# Patient Record
Sex: Female | Born: 1945 | Race: White | Hispanic: No | Marital: Married | Smoking: Never smoker
Health system: Southern US, Community
[De-identification: ages and names within clinical notes are randomized; demographics above are authoritative.]

## PROBLEM LIST (undated history)

## (undated) DIAGNOSIS — I10 Essential (primary) hypertension: Secondary | ICD-10-CM

## (undated) DIAGNOSIS — I639 Cerebral infarction, unspecified: Secondary | ICD-10-CM

## (undated) DIAGNOSIS — E7800 Pure hypercholesterolemia, unspecified: Secondary | ICD-10-CM

## (undated) HISTORY — DX: Cerebral infarction, unspecified: I63.9

## (undated) HISTORY — DX: Pure hypercholesterolemia, unspecified: E78.00

## (undated) HISTORY — PX: BREAST BIOPSY: SHX20

## (undated) HISTORY — PX: CHOLECYSTECTOMY: SHX55

## (undated) HISTORY — DX: Essential (primary) hypertension: I10

## (undated) HISTORY — PX: BREAST EXCISIONAL BIOPSY: SUR124

---

## 2015-03-24 ENCOUNTER — Other Ambulatory Visit: Payer: Self-pay | Admitting: Family Medicine

## 2015-03-24 DIAGNOSIS — N6459 Other signs and symptoms in breast: Secondary | ICD-10-CM

## 2015-04-02 ENCOUNTER — Ambulatory Visit
Admission: RE | Admit: 2015-04-02 | Discharge: 2015-04-02 | Disposition: A | Discharge status: Home / Self Care | Payer: Medicare Other | Source: Ambulatory Visit | Attending: Family Medicine | Admitting: Family Medicine

## 2015-04-02 DIAGNOSIS — N6459 Other signs and symptoms in breast: Secondary | ICD-10-CM

## 2015-10-28 ENCOUNTER — Encounter (INDEPENDENT_AMBULATORY_CARE_PROVIDER_SITE_OTHER): Payer: Medicare Other | Admitting: Ophthalmology

## 2015-10-28 DIAGNOSIS — H43813 Vitreous degeneration, bilateral: Secondary | ICD-10-CM | POA: Diagnosis not present

## 2015-10-28 DIAGNOSIS — H4321 Crystalline deposits in vitreous body, right eye: Secondary | ICD-10-CM

## 2016-04-19 ENCOUNTER — Other Ambulatory Visit: Payer: Self-pay | Admitting: Family Medicine

## 2016-04-19 DIAGNOSIS — Z1231 Encounter for screening mammogram for malignant neoplasm of breast: Secondary | ICD-10-CM

## 2016-04-25 ENCOUNTER — Ambulatory Visit: Payer: Medicare Other

## 2016-05-02 ENCOUNTER — Ambulatory Visit
Admission: RE | Admit: 2016-05-02 | Discharge: 2016-05-02 | Disposition: A | Discharge status: Home / Self Care | Payer: Medicare Other | Source: Ambulatory Visit | Attending: Family Medicine | Admitting: Family Medicine

## 2016-05-02 DIAGNOSIS — Z1231 Encounter for screening mammogram for malignant neoplasm of breast: Secondary | ICD-10-CM

## 2017-04-17 ENCOUNTER — Other Ambulatory Visit: Payer: Self-pay | Admitting: Family Medicine

## 2017-04-17 DIAGNOSIS — Z1231 Encounter for screening mammogram for malignant neoplasm of breast: Secondary | ICD-10-CM

## 2017-06-08 ENCOUNTER — Ambulatory Visit
Admission: RE | Admit: 2017-06-08 | Discharge: 2017-06-08 | Disposition: A | Discharge status: Home / Self Care | Payer: Medicare Other | Source: Ambulatory Visit | Attending: Family Medicine | Admitting: Family Medicine

## 2017-06-08 DIAGNOSIS — Z1231 Encounter for screening mammogram for malignant neoplasm of breast: Secondary | ICD-10-CM

## 2017-06-11 ENCOUNTER — Other Ambulatory Visit: Payer: Self-pay | Admitting: Family Medicine

## 2017-06-11 DIAGNOSIS — R928 Other abnormal and inconclusive findings on diagnostic imaging of breast: Secondary | ICD-10-CM

## 2017-06-19 ENCOUNTER — Ambulatory Visit: Admission: RE | Admit: 2017-06-19 | Payer: Medicare Other | Source: Ambulatory Visit

## 2017-06-19 ENCOUNTER — Ambulatory Visit
Admission: RE | Admit: 2017-06-19 | Discharge: 2017-06-19 | Disposition: A | Discharge status: Home / Self Care | Payer: Medicare Other | Source: Ambulatory Visit | Attending: Family Medicine | Admitting: Family Medicine

## 2017-06-19 DIAGNOSIS — R928 Other abnormal and inconclusive findings on diagnostic imaging of breast: Secondary | ICD-10-CM

## 2018-06-13 ENCOUNTER — Other Ambulatory Visit: Payer: Self-pay | Admitting: Family Medicine

## 2018-06-13 DIAGNOSIS — Z1231 Encounter for screening mammogram for malignant neoplasm of breast: Secondary | ICD-10-CM

## 2018-07-09 ENCOUNTER — Ambulatory Visit
Admission: RE | Admit: 2018-07-09 | Discharge: 2018-07-09 | Disposition: A | Discharge status: Home / Self Care | Payer: Medicare HMO | Source: Ambulatory Visit | Attending: Family Medicine | Admitting: Family Medicine

## 2018-07-09 DIAGNOSIS — Z1231 Encounter for screening mammogram for malignant neoplasm of breast: Secondary | ICD-10-CM

## 2020-06-15 ENCOUNTER — Other Ambulatory Visit: Payer: Self-pay | Admitting: Family Medicine

## 2020-06-15 DIAGNOSIS — Z0000 Encounter for general adult medical examination without abnormal findings: Secondary | ICD-10-CM

## 2020-07-30 ENCOUNTER — Ambulatory Visit: Payer: Medicare HMO

## 2020-08-11 ENCOUNTER — Ambulatory Visit: Payer: Medicare HMO

## 2020-12-09 ENCOUNTER — Ambulatory Visit
Admission: RE | Admit: 2020-12-09 | Discharge: 2020-12-09 | Disposition: A | Discharge status: Home / Self Care | Payer: Medicare HMO | Source: Ambulatory Visit | Attending: Family Medicine | Admitting: Family Medicine

## 2020-12-09 ENCOUNTER — Other Ambulatory Visit: Payer: Self-pay

## 2020-12-09 DIAGNOSIS — Z0000 Encounter for general adult medical examination without abnormal findings: Secondary | ICD-10-CM

## 2021-10-07 ENCOUNTER — Other Ambulatory Visit: Payer: Self-pay | Admitting: Family Medicine

## 2021-10-07 DIAGNOSIS — Z1231 Encounter for screening mammogram for malignant neoplasm of breast: Secondary | ICD-10-CM

## 2021-12-12 ENCOUNTER — Ambulatory Visit: Payer: Medicare HMO

## 2021-12-13 ENCOUNTER — Ambulatory Visit
Admission: RE | Admit: 2021-12-13 | Discharge: 2021-12-13 | Disposition: A | Discharge status: Home / Self Care | Payer: Medicare HMO | Source: Ambulatory Visit | Attending: Family Medicine | Admitting: Family Medicine

## 2021-12-13 DIAGNOSIS — Z1231 Encounter for screening mammogram for malignant neoplasm of breast: Secondary | ICD-10-CM

## 2022-06-22 ENCOUNTER — Institutional Professional Consult (permissible substitution): Payer: Medicare HMO | Admitting: Pulmonary Disease

## 2022-06-28 ENCOUNTER — Institutional Professional Consult (permissible substitution): Payer: Medicare HMO | Admitting: Pulmonary Disease

## 2022-08-03 ENCOUNTER — Ambulatory Visit (INDEPENDENT_AMBULATORY_CARE_PROVIDER_SITE_OTHER): Payer: Medicare HMO | Admitting: Pulmonary Disease

## 2022-08-03 ENCOUNTER — Encounter: Payer: Self-pay | Admitting: Pulmonary Disease

## 2022-08-03 VITALS — BP 130/74 | HR 72 | Ht 60.0 in | Wt 127.0 lb

## 2022-08-03 DIAGNOSIS — R9389 Abnormal findings on diagnostic imaging of other specified body structures: Secondary | ICD-10-CM | POA: Diagnosis not present

## 2022-08-03 NOTE — Progress Notes (Signed)
Sherri Lambert    [id number]    Dec 13, 1945  Primary Care Physician:Eggleston-Clark, Candis Musa, MD  Referring Physician: Cathie Olden, [place] [address]  Chief complaint:   Patient being seen for history of abnormal CT scan of the chest  HPI:  Patient had had a chest x-ray performed in the evaluation of a cough showing a left lung nodule had a CT scan showing a 1.2 Centimeters nodule and some other changes  Chest x-ray film and CT scan is not available at present  Never smoker Worked as a Educational psychologist, Geophysicist/field seismologist, Surveyor, minerals.  Currently retired  Mother did smoke  Has a history of atrial fibrillation-on Eliquis TIA/stroke in 2021  Had [place] in 2020-stated she was admitted for about 1 week History of cholecystectomy in the past  She feels well at the present time and denies any respiratory complaint Is not short of breath, denies a cough, denies chest pain or chest discomfort  History of hypertension, history of mitral valve prolapse    Outpatient Encounter Medications as of 08/03/2022  Medication Sig   ASPIRIN LOW DOSE 81 MG tablet Take 81 mg by mouth daily.   diltiazem (DILACOR XR) 180 MG 24 hr capsule Take by mouth.   ELIQUIS 5 MG TABS tablet SMARTSIG:1 Tablet(s) By Mouth Every 12 Hours   furosemide (LASIX) 20 MG tablet Take 20 mg by mouth every morning.   lisinopril (ZESTRIL) 10 MG tablet Take 10 mg by mouth every morning.   No facility-administered encounter medications on file as of 08/03/2022.    Allergies as of 08/03/2022 - Review Complete 08/03/2022  Allergen Reaction Noted   Sulfa antibiotics Hives 04/16/2008   Alendronate Nausea Only and Rash 06/04/2009   Neomycin-bacitracin zn-polymyx Rash 06/04/2009    Past Medical History:  Diagnosis Date   Abnormality in fetal heart rate or rhythm before the onset of labor    High cholesterol    Hypertension    Stroke [facility])     Past Surgical History:  Procedure  Laterality Date   BREAST BIOPSY     BREAST EXCISIONAL BIOPSY     CHOLECYSTECTOMY      Family History  Problem Relation Age of Onset   Stroke Mother    Hypertension Mother    Breast cancer Neg Hx     Social History   Socioeconomic History   Marital status: Married    Spouse name: Not on file   Number of children: Not on file   Years of education: Not on file   Highest education level: Not on file  Occupational History   Not on file  Tobacco Use   Smoking status: Never   Smokeless tobacco: Never  Vaping Use   Vaping Use: Never used  Substance and Sexual Activity   Alcohol use: Not Currently   Drug use: Not Currently   Sexual activity: Not Currently  Other Topics Concern   Not on file  Social History Narrative   Not on file   Social Determinants of Health   Financial Resource Strain: Not on file  Food Insecurity: Not on file  Transportation Needs: Not on file  Physical Activity: Not on file  Stress: Not on file  Social Connections: Not on file  Intimate Partner Violence: Not on file    Review of Systems  Respiratory:  Negative for cough and shortness of breath.     Vitals:   08/03/22 1041  BP: 130/74  Pulse: 72  SpO2: 99%     Physical Exam Constitutional:      Appearance: Normal appearance.  HENT:     Head: Normocephalic.     Nose: Nose normal.     Mouth/Throat:     Mouth: Mucous membranes are moist.  Eyes:     Pupils: Pupils are equal, round, and reactive to light.  Cardiovascular:     Rate and Rhythm: Normal rate and regular rhythm.     Heart sounds: No murmur heard.    No friction rub.  Pulmonary:     Effort: No respiratory distress.     Breath sounds: No stridor. No wheezing or rhonchi.  Musculoskeletal:     Cervical back: No rigidity or tenderness.  Neurological:     Mental Status: She is alert.  Psychiatric:        Mood and Affect: Mood normal.    Data Reviewed: Records reviewed in Care Everywhere,  Actual CT report was not in  care everywhere, report of chest x-ray reviewed 03/06/2022 Follow-up visit following CT scan reviewed-Dr. Garwin Brothers- clark's notes reviewed  Assessment:  Abnormal CT scan of the chest  Left lung nodule  Never smoker, no occupational predisposition to lung disease Minimal secondhand smoke exposure when she was much younger  Actual films not available  Extensive review of records in care everywhere  Atrial fibrillation -On Eliquis  Hypertension -Controlled  Plan/Recommendations: Record release to have CT loaded into epic  Will review CT and make further determination regarding further evaluation and management  Recommendation documented in care everywhere is repeat CT in 6 to 12 months to follow-up on the nodule  Will be beneficial to review actual CAT scan and determine course of treatment  Tentatively, follow-up appointment in 6 weeks  Encouraged to call with significant concerns  I spent 45 minutes dedicated to the care of this patient on the date of this encounter to include previsit review of records, face-to-face time with the patient discussing conditions above, post visit ordering of testing, clinical documentation with electronic health record and communicated necessary findings to members of the patient's care team   Sherrilyn Rist MD [facility] 08/03/2022, 11:17 AM  CC: Yong Channel*

## 2022-08-03 NOTE — Patient Instructions (Signed)
Follow-up in 6 weeks  Have CT loaded into electronic system so it can be reviewed  Sign medical release to have CT and chest x-ray noted to system  Call with significant concerns

## 2022-08-29 ENCOUNTER — Telehealth: Payer: Self-pay | Admitting: Pulmonary Disease

## 2022-08-30 NOTE — Telephone Encounter (Signed)
Dr. Ander Slade have you received CT results on this patient? She is calling requesting that information. If we need to request those please advise

## 2022-08-31 NOTE — Telephone Encounter (Signed)
Called patient but she did not answer. I could not leave a VM for her. Will attempt to call her back later to see exactly where she had the CT done.

## 2022-08-31 NOTE — Telephone Encounter (Signed)
Please request for the study  If it can be loaded onto our system, it will be helpful so I can review the film itself  I checked in care everywhere and I do not see any results

## 2022-09-25 ENCOUNTER — Ambulatory Visit (INDEPENDENT_AMBULATORY_CARE_PROVIDER_SITE_OTHER): Payer: Medicare HMO

## 2022-09-25 ENCOUNTER — Encounter: Payer: Self-pay | Admitting: Pulmonary Disease

## 2022-09-25 ENCOUNTER — Ambulatory Visit: Payer: Medicare HMO | Admitting: Pulmonary Disease

## 2022-09-25 VITALS — BP 140/76 | HR 58 | Ht 61.0 in | Wt 126.2 lb

## 2022-09-25 DIAGNOSIS — R0602 Shortness of breath: Secondary | ICD-10-CM | POA: Diagnosis not present

## 2022-09-25 NOTE — Progress Notes (Signed)
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   diltiazem (DILACOR XR) 180 MG 24 hr capsule Take by mouth.   ELIQUIS 5 MG TABS tablet SMARTSIG:1 Tablet(s) By Mouth Every 12 Hours   furosemide (LASIX) 20 MG tablet Take 20 mg by mouth every morning.   lisinopril (ZESTRIL) 10 MG tablet Take 10 mg by mouth every morning.   No facility-administered encounter medications on file as of 09/25/2022.    Allergies as of 09/25/2022 - Review Complete 09/25/2022  Allergen Reaction Noted   Sulfa antibiotics Hives 04/16/2008   Alendronate Nausea Only and Rash 06/04/2009   Neomycin-bacitracin zn-polymyx Rash 06/04/2009    Past Medical History:  Diagnosis Date   Abnormality in fetal heart rate or rhythm before the onset of labor    High cholesterol    Hypertension    Stroke [facility])     Past Surgical History:  Procedure  Laterality Date   BREAST BIOPSY     BREAST EXCISIONAL BIOPSY     CHOLECYSTECTOMY      Family History  Problem Relation Age of Onset   Stroke Mother    Hypertension Mother    Breast cancer Neg Hx     Social History   Socioeconomic History   Marital status: Married    Spouse name: Not on file   Number of children: Not on file   Years of education: Not on file   Highest education level: Not on file  Occupational History   Not on file  Tobacco Use   Smoking status: Never   Smokeless tobacco: Never  Vaping Use   Vaping Use: Never used  Substance and Sexual Activity   Alcohol use: Not Currently   Drug use: Not Currently   Sexual activity: Not Currently  Other Topics Concern   Not on file  Social History Narrative   Not on file   Social Determinants of Health   Financial Resource Strain: Not on file  Food Insecurity: Not on file  Transportation Needs: Not on file  Physical Activity: Not on file  Stress: Not on file  Social Connections: Not on file  Intimate Partner Violence: Not on file    Review of Systems  Respiratory:  Negative for cough and shortness of breath.     Vitals:  09/25/22 1047  BP: (!) 140/76  Pulse: (!) 58  SpO2: 100%     Physical Exam Constitutional:      Appearance: Normal appearance.  HENT:     Head: Normocephalic.     Nose: Nose normal.     Mouth/Throat:     Mouth: Mucous membranes are moist.  Eyes:     General: No scleral icterus.    Pupils: Pupils are equal, round, and reactive to light.  Cardiovascular:     Rate and Rhythm: Normal rate and regular rhythm.     Heart sounds: No murmur heard.    No friction rub.  Pulmonary:     Effort: No respiratory distress.     Breath sounds: No stridor. No wheezing or rhonchi.  Musculoskeletal:     Cervical back: No rigidity or tenderness.  Neurological:     Mental Status: She is alert.  Psychiatric:        Mood and Affect: Mood normal.    Data Reviewed: Records reviewed in Care  Everywhere,  Actual CT report was not in care everywhere, report of chest x-ray reviewed 03/06/2022 Follow-up visit following CT scan reviewed-Dr. Garwin Brothers- clark's notes reviewed    Assessment:  Abnormal CT scan of the chest  Left lung nodule  Shortness of breath on exertion  Reviewed records in Care Everywhere  Atrial fibrillation on Eliquis  Hypertension-controlled   Plan/Recommendations:  Sign record release  Request for copy of CT scan  Repeat CT will be due about April/May  Patient is requesting a chest x-ray today to make sure nothing else is in the lungs currently  Encouraged to call with significant concerns  Will schedule for pulmonary function test at next visit based on symptoms of shortness of breath  She does have a follow-up with her primary care doctor in the next couple of weeks  Encouraged to call us with significant concerns   Sherrilyn Rist MD [facility] 09/25/2022, 10:59 AM  CC: Yong Channel*

## 2022-09-25 NOTE — Patient Instructions (Signed)
Chest x-ray today  Record release to get CT loaded to our system  If unable to get CT loaded to our system, you may request for your record and mail it to [place] for me to review  A repeat CT will be due sometime in April/May  Graded exercises as tolerated  Call us with significant concerns

## 2022-10-05 ENCOUNTER — Other Ambulatory Visit: Payer: Self-pay

## 2022-10-05 DIAGNOSIS — Z1231 Encounter for screening mammogram for malignant neoplasm of breast: Secondary | ICD-10-CM

## 2022-11-08 NOTE — Telephone Encounter (Signed)
Pt has had a recent visit with office since phone call was received. Closing encounter.

## 2022-12-15 ENCOUNTER — Ambulatory Visit: Payer: Medicare HMO

## 2022-12-19 ENCOUNTER — Ambulatory Visit: Payer: Medicare HMO

## 2022-12-21 ENCOUNTER — Ambulatory Visit
Admission: RE | Admit: 2022-12-21 | Discharge: 2022-12-21 | Disposition: A | Discharge status: Home / Self Care | Payer: Medicare HMO | Source: Ambulatory Visit | Attending: Family Medicine | Admitting: Family Medicine

## 2022-12-21 DIAGNOSIS — Z1231 Encounter for screening mammogram for malignant neoplasm of breast: Secondary | ICD-10-CM

## 2023-01-29 ENCOUNTER — Encounter (HOSPITAL_BASED_OUTPATIENT_CLINIC_OR_DEPARTMENT_OTHER): Payer: Medicare HMO

## 2023-01-29 ENCOUNTER — Ambulatory Visit: Payer: Medicare HMO | Admitting: Pulmonary Disease

## 2023-04-17 ENCOUNTER — Ambulatory Visit: Payer: Medicare HMO | Admitting: Pulmonary Disease

## 2023-04-17 ENCOUNTER — Encounter: Payer: Self-pay | Admitting: Pulmonary Disease

## 2023-04-17 VITALS — BP 138/68 | HR 78 | Temp 98.6°F | Ht 60.0 in | Wt 128.2 lb

## 2023-04-17 DIAGNOSIS — R9389 Abnormal findings on diagnostic imaging of other specified body structures: Secondary | ICD-10-CM | POA: Diagnosis not present

## 2023-04-17 DIAGNOSIS — R942 Abnormal results of pulmonary function studies: Secondary | ICD-10-CM

## 2023-04-17 DIAGNOSIS — J438 Other emphysema: Secondary | ICD-10-CM

## 2023-04-17 DIAGNOSIS — R0602 Shortness of breath: Secondary | ICD-10-CM

## 2023-04-17 LAB — PULMONARY FUNCTION TEST
DL/VA % pred: 99 %
DL/VA: 4.19 ml/min/mmHg/L
DLCO cor % pred: 199 %
DLCO cor: 33.06 ml/min/mmHg
DLCO unc % pred: 199 %
DLCO unc: 33.06 ml/min/mmHg
FEF 25-75 Pre: 2.35 L/sec
FEF2575-%Pred-Pre: 169 %
FEV1-%Pred-Pre: 96 %
FEV1-Pre: 1.65 L
FEV1FVC-%Pred-Pre: 132 %
FEV6-%Pred-Pre: 76 %
FEV6-Pre: 1.67 L
FEV6FVC-%Pred-Pre: 105 %
FVC-%Pred-Pre: 72 %
FVC-Pre: 1.67 L
Pre FEV1/FVC ratio: 99 %
Pre FEV6/FVC Ratio: 100 %
RV % pred: 112 %
RV: 2.37 L
TLC % pred: 157 %
TLC: 7 L

## 2023-04-17 NOTE — Patient Instructions (Signed)
We will schedule him for CT scan of the chest to check on the spot in the lung  Continue to stay active  The breathing study does suggest you have some emphysema, as you are not having any significant symptoms at the present time I think we will just keep an eye on things  Call us with significant concerns  I will see you in 3 months

## 2023-04-17 NOTE — Patient Instructions (Signed)
Full PFT done today except Post Cleda Daub due to not passing the Pre Cleda Daub.

## 2023-04-17 NOTE — Progress Notes (Signed)
Full PFT done today except Post Cleda Daub due to not passing the Pre Cleda Daub.

## 2023-04-17 NOTE — Progress Notes (Signed)
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   diltiazem (DILACOR XR) 180 MG 24 hr capsule Take by mouth.   ELIQUIS 5 MG TABS tablet SMARTSIG:1 Tablet(s) By Mouth Every 12 Hours   furosemide (LASIX) 20 MG tablet Take 20 mg by mouth every morning.   lisinopril (ZESTRIL) 10 MG tablet Take 10 mg by mouth every morning.   No facility-administered encounter medications on file as of 04/17/2023.    Allergies as of 04/17/2023 - Review Complete 04/17/2023  Allergen Reaction Noted   Sulfa antibiotics Hives 04/16/2008   Alendronate Nausea Only and Rash 06/04/2009   Neomycin-bacitracin zn-polymyx Rash 06/04/2009    Past Medical History:  Diagnosis Date   Abnormality in fetal heart rate or rhythm before the onset of labor    High cholesterol    Hypertension    Stroke [facility])     Past Surgical  History:  Procedure Laterality Date   BREAST BIOPSY     BREAST EXCISIONAL BIOPSY     CHOLECYSTECTOMY      Family History  Problem Relation Age of Onset   Stroke Mother    Hypertension Mother    Breast cancer Neg Hx     Social History   Socioeconomic History   Marital status: Married    Spouse name: Not on file   Number of children: Not on file   Years of education: Not on file   Highest education level: Not on file  Occupational History   Not on file  Tobacco Use   Smoking status: Never   Smokeless tobacco: Never  Vaping Use   Vaping status: Never Used  Substance and Sexual Activity   Alcohol use: Not Currently   Drug use: Not Currently   Sexual activity: Not Currently  Other Topics Concern   Not on file  Social History Narrative   Not on file   Social Determinants of Health   Financial Resource Strain: Not on file  Food Insecurity: Not on file  Transportation Needs: Not on file  Physical Activity: Not on file  Stress: Not on file  Social Connections: Not on file  Intimate Partner Violence: Not on file    Review of Systems  Respiratory:  Negative for cough and shortness of breath.  Vitals:   04/17/23 1101  BP: 138/68  Pulse: 78  Temp: 98.6 F (37 C)  SpO2: 99%     Physical Exam Constitutional:      Appearance: Normal appearance.  HENT:     Head: Normocephalic.     Nose: Nose normal.     Mouth/Throat:     Mouth: Mucous membranes are moist.  Eyes:     General: No scleral icterus.    Pupils: Pupils are equal, round, and reactive to light.  Cardiovascular:     Rate and Rhythm: Normal rate and regular rhythm.     Heart sounds: No murmur heard.    No friction rub.  Pulmonary:     Effort: No respiratory distress.     Breath sounds: No stridor. No wheezing or rhonchi.  Musculoskeletal:     Cervical back: No rigidity or tenderness.  Neurological:     Mental Status: She is alert.  Psychiatric:        Mood and Affect: Mood normal.     Data Reviewed: Records reviewed in Care Everywhere,  Actual CT report was not in care everywhere, report of chest x-ray reviewed 03/06/2022 Follow-up visit following CT scan reviewed-Dr. Jearl Klinefelter- clark's notes reviewed  Chest x-ray reviewed showing hyperinflation  PFT reviewed with the patient showing some air trapping, study was suboptimal from patient not been able to follow instructions closely   Assessment:  Abnormal CT scan of the chest -Showing a lung nodule  Left lung nodule -Did not show up on the chest x-ray  Shortness of breath on exertion -This is significantly improved compared to the last time she was here  Atrial fibrillation on Eliquis  Hypertension-controlled   Plan/Recommendations:  Repeat CT scan of the chest  I will see you back in 3 months  Your chest x-rays were reviewed today does show evidence of emphysema, we do not need to start any inhalers or treatment at present  Encouraged to call with significant concerns  I will see you in 3 months    Virl Diamond MD [facility] 04/17/2023, 11:03 AM  CC: Gennie Alma*

## 2023-05-01 ENCOUNTER — Telehealth: Payer: Self-pay | Admitting: Pulmonary Disease

## 2023-05-01 DIAGNOSIS — R9389 Abnormal findings on diagnostic imaging of other specified body structures: Secondary | ICD-10-CM

## 2023-05-01 NOTE — Telephone Encounter (Signed)
PT was told on last AVS we would sched a CT for her 3 mo FU in Dec. I did not see an order in for it  Please call Cell, she [phone]

## 2023-05-04 NOTE — Telephone Encounter (Signed)
Last not said repeat chest ct with 3 month FU. Ordered to be done in December.

## 2023-07-04 ENCOUNTER — Inpatient Hospital Stay
Admission: RE | Admit: 2023-07-04 | Discharge: 2023-07-04 | Disposition: A | Discharge status: Home / Self Care | Payer: Medicare HMO | Source: Ambulatory Visit | Attending: Pulmonary Disease

## 2023-07-04 ENCOUNTER — Other Ambulatory Visit: Payer: Self-pay | Admitting: Pulmonary Disease

## 2023-07-04 DIAGNOSIS — R9389 Abnormal findings on diagnostic imaging of other specified body structures: Secondary | ICD-10-CM

## 2023-07-23 ENCOUNTER — Encounter: Payer: Self-pay | Admitting: Pulmonary Disease

## 2023-07-23 ENCOUNTER — Ambulatory Visit: Payer: Medicare HMO | Admitting: Pulmonary Disease

## 2023-07-23 VITALS — BP 138/72 | HR 78 | Ht 60.0 in | Wt 126.4 lb

## 2023-07-23 DIAGNOSIS — R9389 Abnormal findings on diagnostic imaging of other specified body structures: Secondary | ICD-10-CM

## 2023-07-23 NOTE — Progress Notes (Signed)
Sherri Lambert    [id number]    08-30-1945  Primary Care Physician:Eggleston-Clark, Joella Prince, MD  Referring Physician: Valla Leaver, MD [address] [place],  [place] [address]  Chief complaint:   Patient being seen for history of abnormal CT scan of the chest  HPI:  Patient had had a chest x-ray performed in the evaluation of a cough showing a left lung nodule had a CT scan showing a 1.2 centimeter nodule  CT scan was repeated showing nodularity around the sleeves of the lung on the right side -No obvious mass or growth  We will since her last visit  Does have some Aer8  Never smoker, 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   diltiazem (DILACOR XR) 180 MG 24 hr capsule Take by mouth.   ELIQUIS 5 MG TABS tablet SMARTSIG:1 Tablet(s) By Mouth Every 12 Hours   furosemide (LASIX) 20 MG tablet Take 20 mg by mouth every morning.   lisinopril (ZESTRIL) 10 MG tablet Take 10 mg by mouth every morning.   No facility-administered encounter medications on file as of 07/23/2023.    Allergies as of 07/23/2023 - Review Complete 07/23/2023  Allergen Reaction Noted   Sulfa antibiotics Hives 04/16/2008   Alendronate Nausea Only and Rash 06/04/2009   Neomycin-bacitracin zn-polymyx Rash 06/04/2009    Past Medical History:  Diagnosis Date   Abnormality in fetal heart rate or rhythm before the onset of labor     High cholesterol    Hypertension    Stroke [facility])     Past Surgical History:  Procedure Laterality Date   BREAST BIOPSY     BREAST EXCISIONAL BIOPSY     CHOLECYSTECTOMY      Family History  Problem Relation Age of Onset   Stroke Mother    Hypertension Mother    Breast cancer Neg Hx     Social History   Socioeconomic History   Marital status: Married    Spouse name: Not on file   Number of children: Not on file   Years of education: Not on file   Highest education level: Not on file  Occupational History   Not on file  Tobacco Use   Smoking status: Never   Smokeless tobacco: Never  Vaping Use   Vaping status: Never Used  Substance and Sexual Activity   Alcohol use: Not Currently   Drug use: Not Currently   Sexual activity: Not Currently  Other Topics Concern   Not on file  Social History Narrative   Not on file   Social Drivers of Health   Financial Resource Strain: Not on file  Food Insecurity: Not on file  Transportation Needs: Not on file  Physical Activity: Not on file  Stress: Not on file  Social Connections: Not on file  Intimate Partner Violence: Not on file    Review of Systems  Respiratory:  Negative for cough and shortness of breath.     Vitals:   07/23/23 1334  BP: 138/72  Pulse: 78  SpO2: 99%     Physical Exam Constitutional:      Appearance: Normal appearance.  HENT:     Head: Normocephalic.     Nose: Nose normal.     Mouth/Throat:     Mouth: Mucous membranes are moist.  Eyes:     General: No scleral icterus.    Pupils: Pupils are equal, round, and reactive to light.  Cardiovascular:     Rate and Rhythm: Normal rate and regular rhythm.     Heart sounds: No murmur heard.    No friction rub.  Pulmonary:     Effort: No respiratory distress.     Breath sounds: No stridor. No wheezing or rhonchi.  Musculoskeletal:     Cervical back: No rigidity or tenderness.  Neurological:     Mental Status: She is alert.  Psychiatric:         Mood and Affect: Mood normal.    Data Reviewed: Records reviewed in Care Everywhere,  Actual CT report was not in care everywhere, report of chest x-ray reviewed 03/06/2022 Follow-up visit following CT scan reviewed-Dr. Jearl Klinefelter- clark's notes reviewed  Current CT reviewed by myself showing nodularity around the pleural sleeve on the right side No mass lesions noted  PFT reviewed with the patient showing some air trapping, study was suboptimal from patient not been able to follow instructions closely   Assessment:  Abnormal CT with lung nodule  Follow-up CT in a year is appropriate  Shortness of breath on exertion appears to be stable  History of atrial fibrillation on Eliquis  Hypertension is controlled  Some evidence of emphysema  Plan/Recommendations:  Repeat CT scan of the chest in a year from now  Call with any significant concerns  I will see you a year from now     Virl Diamond MD [facility] [facility] 07/23/2023, 1:46 PM  CC: Gennie Alma*

## 2023-07-23 NOTE — Patient Instructions (Signed)
Your CT scan looks fine There is thickening around the covering of the lung showing a possible spot that we need to follow-up  Repeat CT scan of the chest in a year  I will see you after that CT scan  Call us with significant concerns

## 2023-10-23 ENCOUNTER — Other Ambulatory Visit: Payer: Self-pay | Admitting: Family Medicine

## 2023-10-23 DIAGNOSIS — Z1231 Encounter for screening mammogram for malignant neoplasm of breast: Secondary | ICD-10-CM

## 2023-12-24 ENCOUNTER — Ambulatory Visit
Admission: RE | Admit: 2023-12-24 | Discharge: 2023-12-24 | Disposition: A | Discharge status: Home / Self Care | Source: Ambulatory Visit | Attending: Family Medicine | Admitting: Family Medicine

## 2023-12-24 DIAGNOSIS — Z1231 Encounter for screening mammogram for malignant neoplasm of breast: Secondary | ICD-10-CM

## 2024-02-18 IMAGING — MG MM DIGITAL SCREENING BILAT W/ TOMO AND CAD
8 series · 8 of 24 positions shown · non-contrast
Comparison: Previous exam(s).

CLINICAL DATA: Screening.

EXAM:
DIGITAL SCREENING BILATERAL MAMMOGRAM WITH TOMOSYNTHESIS AND CAD
TECHNIQUE: Bilateral screening digital craniocaudal and mediolateral oblique
mammograms were obtained. Bilateral screening digital breast
tomosynthesis was performed. The images were evaluated with
computer-aided detection.

[R MLO synth-2D]
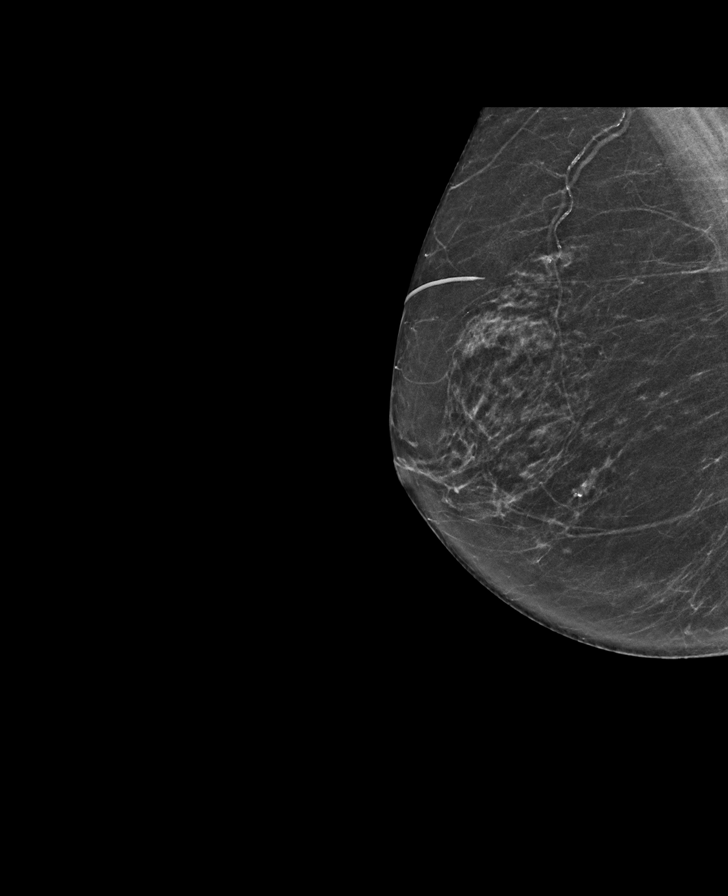

[R CC synth-2D]
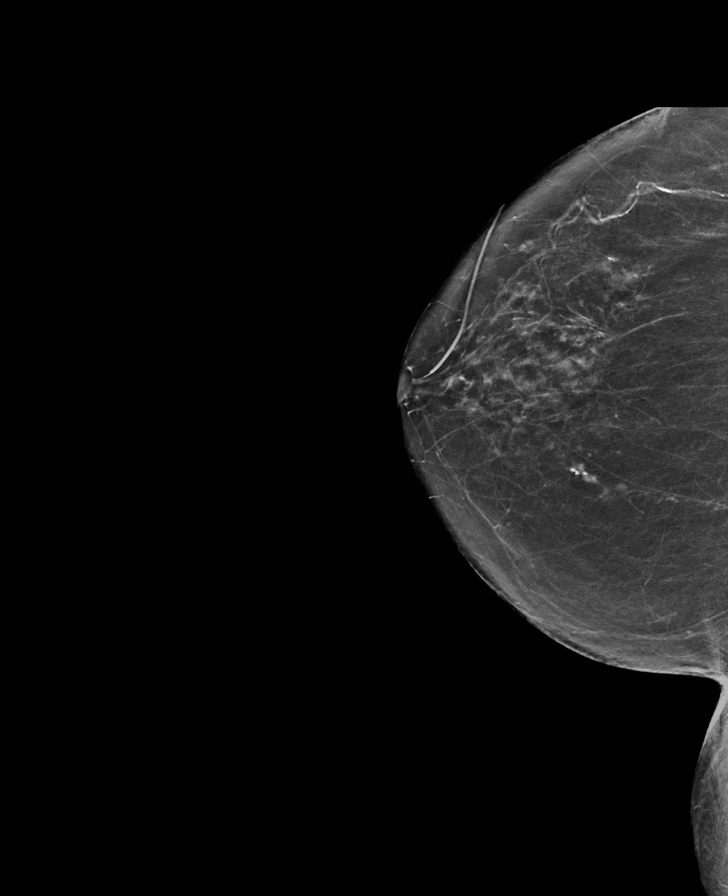

[L CC synth-2D]
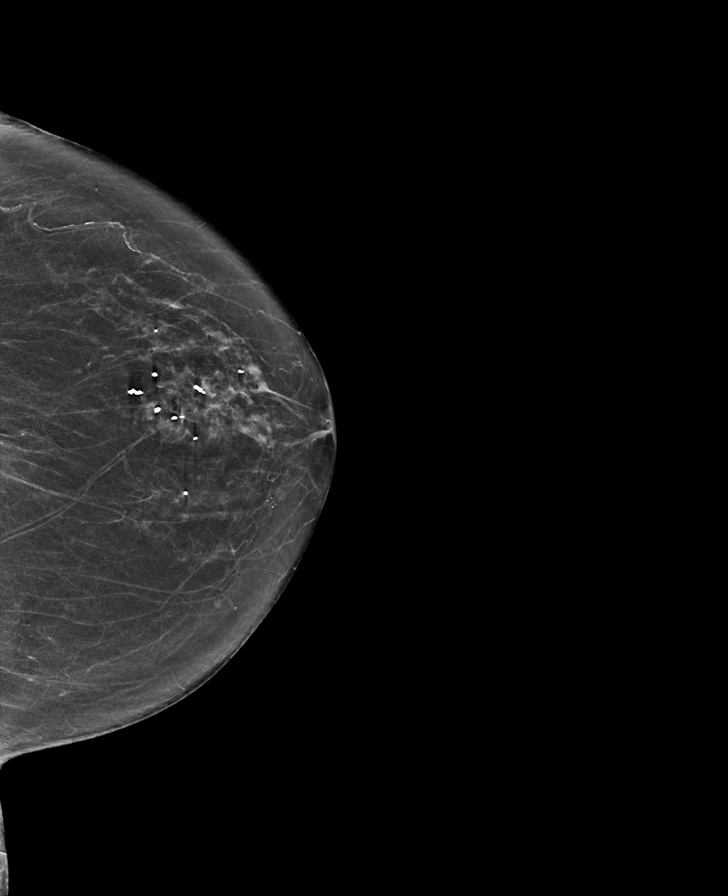

[L MLO synth-2D]
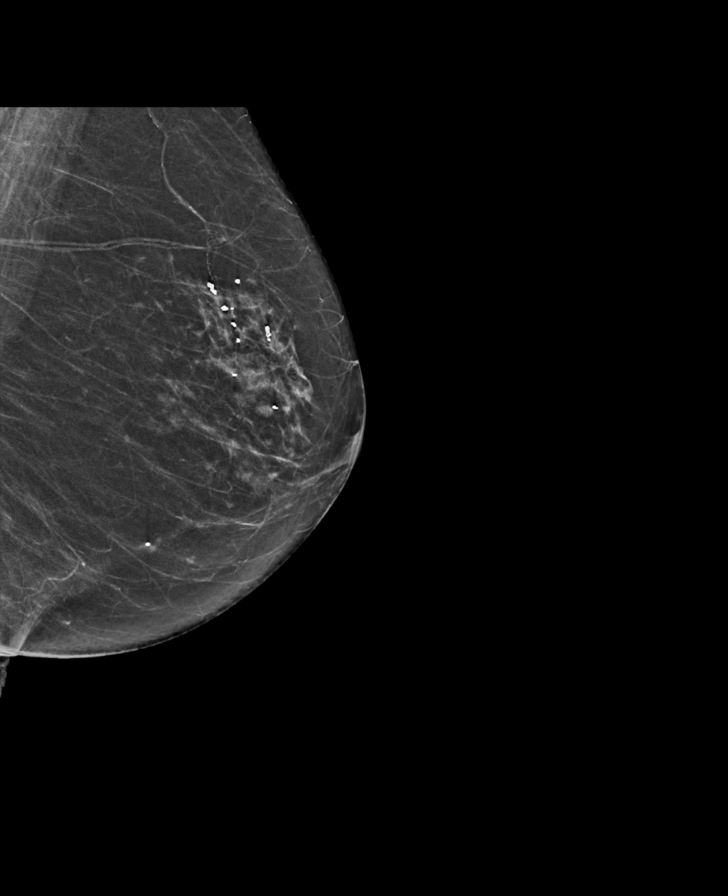

[R CC tomo · tomo slice 33/66.0]
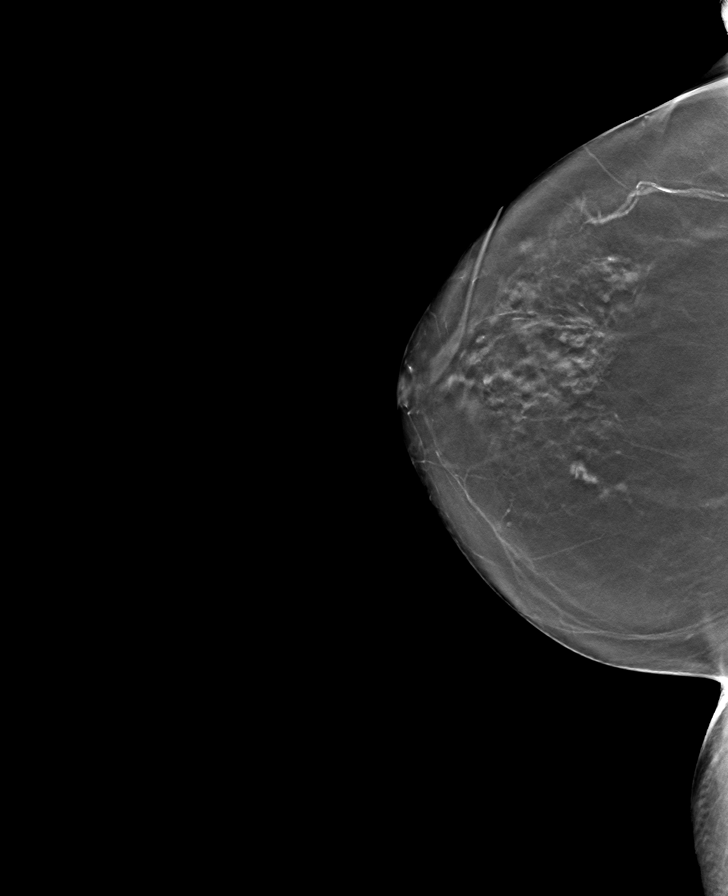

[R MLO tomo · tomo slice 30/59.0]
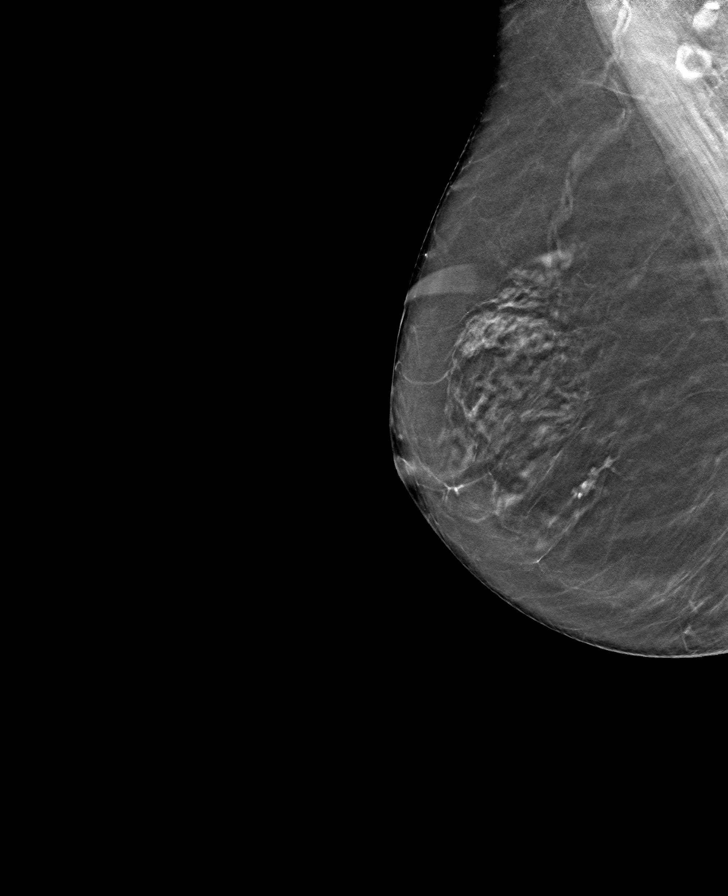

[L MLO tomo · tomo slice 30/59.0]
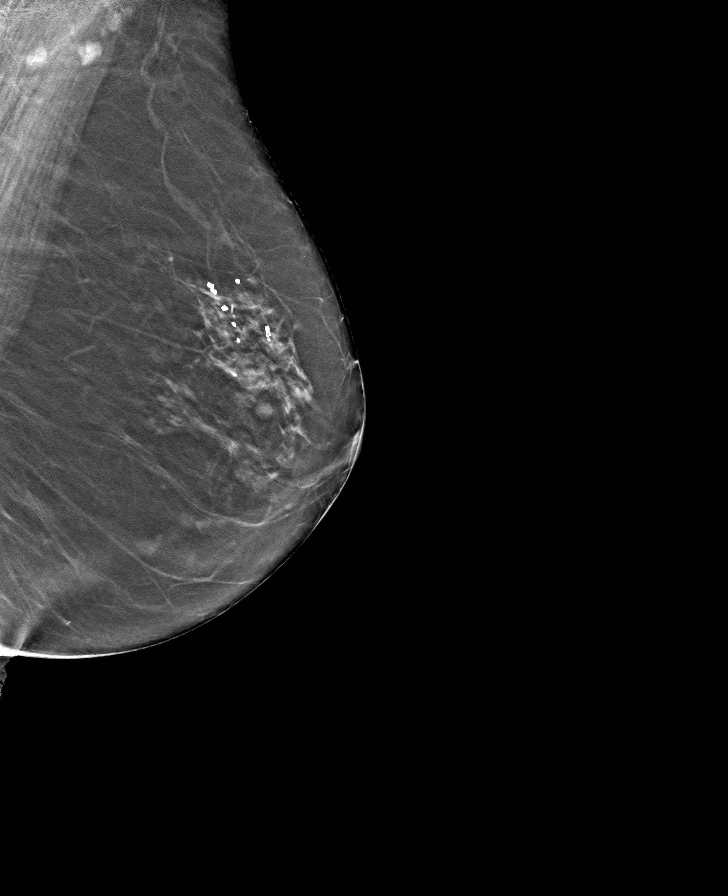

[L CC tomo · tomo slice 31/62.0]
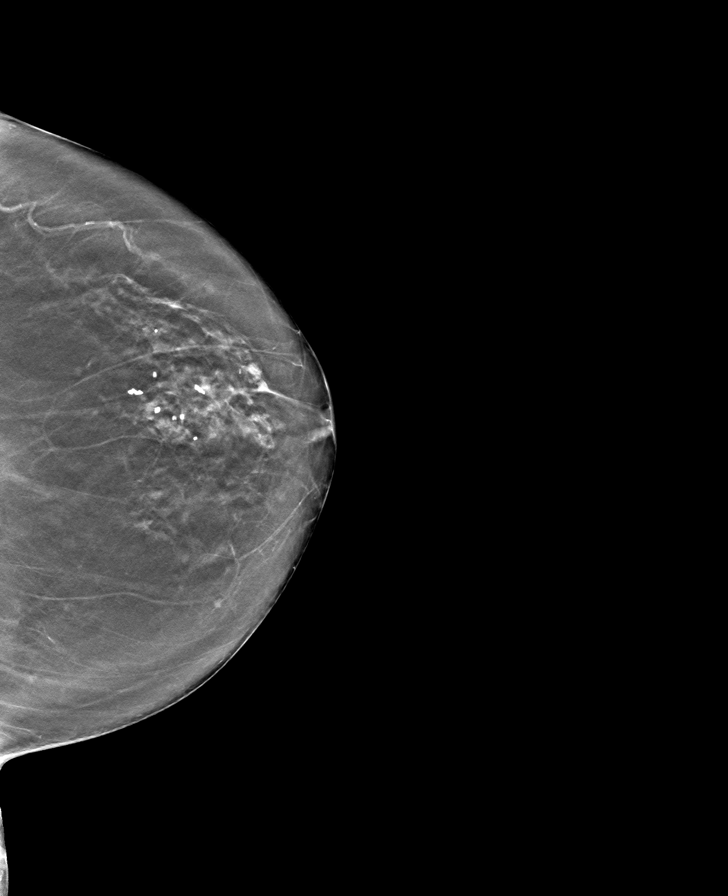

[8 of 24 positions shown; findings below may reference images not displayed]

ACR Breast Density Category b: There are scattered areas of
fibroglandular density.
FINDINGS: There are no findings suspicious for malignancy.
IMPRESSION: No mammographic evidence of malignancy. A result letter of this
screening mammogram will be mailed directly to the patient.

RECOMMENDATION:
Screening mammogram in one year. (Code:51-O-LD2)

BI-RADS CATEGORY  1: Negative.

## 2024-03-13 ENCOUNTER — Telehealth: Payer: Self-pay | Admitting: Pulmonary Disease

## 2024-03-13 NOTE — Telephone Encounter (Signed)
 There has been no Ct that was ordered for the patient. Routing to Dr.Olalere if patient needs a CT?

## 2024-03-13 NOTE — Telephone Encounter (Unsigned)
 Copied from CRM #[id number]. Topic: Appointments - Scheduling Inquiry for Clinic >> Mar 13, 2024  2:41 PM Sherri Lambert wrote: Reason for CRM: Patient calling back to schedule for CT indicated as needed in recall appointment. Follow-up scheduled. Patient hung up on CT and would like to schedule that as well.

## 2024-03-24 ENCOUNTER — Other Ambulatory Visit: Payer: Self-pay | Admitting: Pulmonary Disease

## 2024-03-24 DIAGNOSIS — R911 Solitary pulmonary nodule: Secondary | ICD-10-CM

## 2024-03-24 NOTE — Telephone Encounter (Signed)
 CT ordered.

## 2024-04-25 ENCOUNTER — Ambulatory Visit
Admission: RE | Admit: 2024-04-25 | Discharge: 2024-04-25 | Disposition: A | Discharge status: Home / Self Care | Source: Ambulatory Visit | Attending: Pulmonary Disease | Admitting: Pulmonary Disease

## 2024-04-25 DIAGNOSIS — R911 Solitary pulmonary nodule: Secondary | ICD-10-CM

## 2024-07-15 ENCOUNTER — Ambulatory Visit: Admitting: Pulmonary Disease

## 2024-07-15 ENCOUNTER — Encounter: Payer: Self-pay | Admitting: Pulmonary Disease

## 2024-07-15 VITALS — BP 138/70 | HR 73 | Ht 60.0 in | Wt 131.0 lb

## 2024-07-15 DIAGNOSIS — R053 Chronic cough: Secondary | ICD-10-CM | POA: Diagnosis not present

## 2024-07-15 DIAGNOSIS — R911 Solitary pulmonary nodule: Secondary | ICD-10-CM | POA: Diagnosis not present

## 2024-07-15 DIAGNOSIS — R9389 Abnormal findings on diagnostic imaging of other specified body structures: Secondary | ICD-10-CM

## 2024-07-15 NOTE — Progress Notes (Signed)
 "              Sherri Lambert    [id number]    1945/11/27  Primary Care Physician:Eggleston-Clark, Ollen, MD  Referring Physician: Marian Ollen, MD [id number] [facility] [address] [place],  [place] [address]  Chief complaint:   Patient being followed up for abnormal CT chest showing lung nodules She does have occasional cough especially in the evenings  Discussed the use of AI scribe software for clinical note transcription with the patient, who gave verbal consent to proceed.  History of Present Illness Sherri Lambert is a 78 year old female who presents with a persistent cough and concerns about lung health.  She experiences a persistent cough, particularly at night, which is sometimes dry and occasionally produces a small amount of white mucus. No chest pain or significant mucus production during the day. Occasional wheezing is noted.  Her activity level has decreased, and she has gained a few pounds, which she attributes to reduced physical activity. She wants to increase her physical activity but has not yet implemented any changes.  She has a history of a 1.2 cm spot on her lung, identified in a CT scan prior to her initial visit. She recalls that subsequent CT scans have not shown any new spots. She has never smoked and is concerned about the possibility of cancer.  She sleeps well at night and does not experience significant breathing difficulties. However, she can no longer engage in activities like running or dancing, which she used to enjoy, and this has affected her mood.  Occasional cough in the evening, No associated shortness of breath  Occasional mucus   Outpatient Encounter Medications as of 07/15/2024  Medication Sig   ASPIRIN LOW DOSE 81 MG tablet Take 81 mg by mouth daily.   diltiazem (DILACOR XR) 180 MG 24 hr capsule Take by mouth.   ELIQUIS 5 MG TABS tablet SMARTSIG:1 Tablet(s) By Mouth Every 12 Hours   furosemide (LASIX) 20 MG tablet Take 20 mg by mouth  every morning.   lisinopril (ZESTRIL) 10 MG tablet Take 10 mg by mouth every morning.   No facility-administered encounter medications on file as of 07/15/2024.    Allergies as of 07/15/2024 - Review Complete 07/15/2024  Allergen Reaction Noted   Sulfa antibiotics Hives 04/16/2008   Alendronate Nausea Only and Rash 06/04/2009   Neomycin-bacitracin zn-polymyx Rash 06/04/2009    Past Medical History:  Diagnosis Date   Abnormality in fetal heart rate or rhythm before the onset of labor    High cholesterol    Hypertension    Stroke [facility])     Past Surgical History:  Procedure Laterality Date   BREAST BIOPSY     BREAST EXCISIONAL BIOPSY     CHOLECYSTECTOMY      Family History  Problem Relation Age of Onset   Stroke Mother    Hypertension Mother    Breast cancer Neg Hx     Social History   Socioeconomic History   Marital status: Married    Spouse name: Not on file   Number of children: Not on file   Years of education: Not on file   Highest education level: Not on file  Occupational History   Not on file  Tobacco Use   Smoking status: Never   Smokeless tobacco: Never  Vaping Use   Vaping status: Never Used  Substance and Sexual Activity   Alcohol use: Not Currently   Drug use: Not Currently   Sexual activity:  Not Currently  Other Topics Concern   Not on file  Social History Narrative   Not on file   Social Drivers of Health   Tobacco Use: Low Risk (07/15/2024)   Patient History    Smoking Tobacco Use: Never    Smokeless Tobacco Use: Never    Passive Exposure: Not on file  Financial Resource Strain: Not on file  Food Insecurity: Not on file  Transportation Needs: Not on file  Physical Activity: Not on file  Stress: Not on file  Social Connections: Not on file  Intimate Partner Violence: Not on file  Depression (EYV7-0): Not on file  Alcohol Screen: Not on file  Housing: Not on file  Utilities: Not on file  Health Literacy: Not on file    Review  of Systems  Respiratory:  Positive for cough. Negative for shortness of breath.     Vitals:   07/15/24 1311  BP: 138/70  Pulse: 73  SpO2: 98%     Physical Exam Constitutional:      Appearance: Normal appearance.  HENT:     Head: Normocephalic.     Mouth/Throat:     Mouth: Mucous membranes are moist.  Eyes:     General: No scleral icterus.    Pupils: Pupils are equal, round, and reactive to light.  Cardiovascular:     Rate and Rhythm: Normal rate and regular rhythm.     Heart sounds: No murmur heard. Pulmonary:     Effort: No respiratory distress.     Breath sounds: No stridor. No wheezing or rhonchi.  Musculoskeletal:     Cervical back: No rigidity.  Skin:    General: Skin is warm.  Neurological:     General: No focal deficit present.     Mental Status: She is alert.  Psychiatric:        Mood and Affect: Mood normal.      Data Reviewed: CT chest 04/25/2024 reviewed with patient and compared with 07/04/2023 showing lung nodules - Stable  Assessment and Plan Assessment & Plan Chronic cough and stable pulmonary nodule Chronic cough, primarily nocturnal, with occasional mucus production. Occasional mild wheezing during coughing, but no significant dyspnea. Pulmonary nodule remains unchanged on recent CT scan, measuring 1.2 cm. No new lesions or concerning features. Differential includes chronic bronchitis, but symptoms are mild. No evidence of malignancy due to stability of nodule and absence of growth. No smoking history. Cough does not significantly impact sleep or daily activities. - Will repeat CT scan in one year to monitor pulmonary nodule. - Will consider inhaler if cough worsens or becomes bothersome. - Advised to call if cough becomes concerning before the next scheduled CT scan.   Call us  with significant concerns -Call us  if you want to try an inhaler for the cough - She really does not want to try anything new for the cough at the present time but I  encouraged her to give us  a call if she changes her mind - Can consider a steroid inhaler  Graded activities as tolerated  Orders Placed This Encounter  Procedures   CT Chest Wo Contrast    CT scan of the lung for lung nodules to be repeated in 1 year    Standing Status:   Future    Expected Date:   05/15/2025    Expiration Date:   08/13/2025    Preferred imaging location?:   [facility]      Jennet Epley MD [facility]  07/15/2024, 1:41 PM  CC: Marian Pat, MD   "

## 2024-07-15 NOTE — Patient Instructions (Signed)
 Your CAT scan is stable  The spot in the lung is stable, we will repeat the CAT scan a year from now  Regarding the cough, if you are bothered by it, we can try an inhaler you can always call to let us  know if you want to try an inhaler  There is nothing on your CAT scan that suggest that this is cancerous  I will see you a year from now  Call us  with significant concerns
